# Patient Record
Sex: Female | Born: 1995 | Race: White | Hispanic: No | Marital: Single
Health system: Southern US, Community
[De-identification: ages and names within clinical notes are randomized; demographics above are authoritative.]

---

## 2017-07-14 ENCOUNTER — Emergency Department (HOSPITAL_COMMUNITY): Payer: Managed Care, Other (non HMO)

## 2017-07-14 ENCOUNTER — Encounter (HOSPITAL_COMMUNITY): Payer: Self-pay | Admitting: Emergency Medicine

## 2017-07-14 ENCOUNTER — Other Ambulatory Visit: Payer: Self-pay

## 2017-07-14 ENCOUNTER — Emergency Department (HOSPITAL_COMMUNITY)
Admission: EM | Admit: 2017-07-14 | Discharge: 2017-07-14 | Disposition: A | Discharge status: Home / Self Care | Payer: Managed Care, Other (non HMO) | Attending: Emergency Medicine | Admitting: Emergency Medicine

## 2017-07-14 DIAGNOSIS — Z79899 Other long term (current) drug therapy: Secondary | ICD-10-CM | POA: Diagnosis not present

## 2017-07-14 DIAGNOSIS — R0789 Other chest pain: Secondary | ICD-10-CM | POA: Insufficient documentation

## 2017-07-14 DIAGNOSIS — Z793 Long term (current) use of hormonal contraceptives: Secondary | ICD-10-CM | POA: Insufficient documentation

## 2017-07-14 LAB — COMPREHENSIVE METABOLIC PANEL
ALT: 12 U/L — AB (ref 14–54)
ANION GAP: 11 (ref 5–15)
AST: 19 U/L (ref 15–41)
Albumin: 3.7 g/dL (ref 3.5–5.0)
Alkaline Phosphatase: 44 U/L (ref 38–126)
BUN: 12 mg/dL (ref 6–20)
CHLORIDE: 105 mmol/L (ref 101–111)
CO2: 21 mmol/L — ABNORMAL LOW (ref 22–32)
CREATININE: 0.66 mg/dL (ref 0.44–1.00)
Calcium: 8.8 mg/dL — ABNORMAL LOW (ref 8.9–10.3)
Glucose, Bld: 92 mg/dL (ref 65–99)
Potassium: 4.1 mmol/L (ref 3.5–5.1)
Sodium: 137 mmol/L (ref 135–145)
Total Bilirubin: 0.7 mg/dL (ref 0.3–1.2)
Total Protein: 6.6 g/dL (ref 6.5–8.1)

## 2017-07-14 LAB — I-STAT TROPONIN, ED: Troponin i, poc: 0.01 ng/mL (ref 0.00–0.08)

## 2017-07-14 LAB — CBC
HCT: 33.9 % — ABNORMAL LOW (ref 36.0–46.0)
Hemoglobin: 11.3 g/dL — ABNORMAL LOW (ref 12.0–15.0)
MCH: 30.4 pg (ref 26.0–34.0)
MCHC: 33.3 g/dL (ref 30.0–36.0)
MCV: 91.1 fL (ref 78.0–100.0)
PLATELETS: 233 10*3/uL (ref 150–400)
RBC: 3.72 MIL/uL — AB (ref 3.87–5.11)
RDW: 13.3 % (ref 11.5–15.5)
WBC: 10.7 10*3/uL — ABNORMAL HIGH (ref 4.0–10.5)

## 2017-07-14 LAB — I-STAT BETA HCG BLOOD, ED (MC, WL, AP ONLY)

## 2017-07-14 LAB — D-DIMER, QUANTITATIVE (NOT AT ARMC): D DIMER QUANT: 0.45 ug{FEU}/mL (ref 0.00–0.50)

## 2017-07-14 MED ORDER — KETOROLAC TROMETHAMINE 15 MG/ML IJ SOLN
15.0000 mg | Freq: Once | INTRAMUSCULAR | Status: AC
  Administered 2017-07-14: 15 mg via INTRAMUSCULAR

## 2017-07-14 MED ORDER — KETOROLAC TROMETHAMINE 15 MG/ML IJ SOLN
15.0000 mg | Freq: Once | INTRAMUSCULAR | Status: DC
  Filled 2017-07-14: qty 1

## 2017-07-14 NOTE — ED Triage Notes (Signed)
Pt complaint of left chest pain onset 0500; denies cough.

## 2017-07-14 NOTE — ED Provider Notes (Signed)
[facility]-EMERGENCY DEPT Provider Note  CSN: [id number] Arrival date & time: 07/14/17 1345  Chief Complaint(s) Chest Pain  HPI Katrina Hess is a 22 y.o. female   The history is provided by the patient.  Chest Pain   This is a new problem. Episode onset: 15 hrs. The problem occurs constantly. The problem has not changed since onset.The pain is present in the substernal region. The pain is moderate. The quality of the pain is described as pressure-like and stabbing. The pain radiates to the left shoulder (intermittent). The symptoms are aggravated by deep breathing. Associated symptoms include back pain. Pertinent negatives include no cough, no fever, no leg pain, no lower extremity edema, no nausea and no vomiting.    Patient reports that she was on a long car ride from [place] to here at the end of January.  Patient also takes OCPs.  Denies any prior history of DVT or PEs.  No history of cancer.  No history of autoimmune or clotting disorders.   Past Medical History History reviewed. No pertinent past medical history. There are no active problems to display for this patient.  Home Medication(s) Prior to Admission medications   Medication Sig Start Date End Date Taking? Authorizing Provider  BIOTIN PO Take 1 tablet by mouth daily.    Yes [provider]  CALCIUM PO Take 1 tablet by mouth daily.    Yes [provider]  Cholecalciferol (VITAMIN D PO) Take 1 tablet by mouth daily.    Yes [provider]  MAGNESIUM PO Take 1 tablet by mouth daily.    Yes [provider]  norethindrone-ethinyl estradiol (JUNEL FE,GILDESS FE,LOESTRIN FE) 1-20 MG-MCG tablet Take 1 tablet by mouth daily.   Yes [provider]  Omega-3 Fatty Acids (FISH OIL PO) Take 1 tablet by mouth daily.    Yes [provider]  sertraline (ZOLOFT) 50 MG tablet TAKE 1/2 TAB (25 MG) ORALLY DAILY FOR 5 DAYS THEN INCREASE TO 1 TAB (50 MG) ORALLY DAILY  07/04/17  Yes [provider]                                                                                                                                    Past Surgical History History reviewed. No pertinent surgical history. Family History No family history on file.  Social History Social History   Tobacco Use  . Smoking status: Not on file  Substance Use Topics  . Alcohol use: Not on file  . Drug use: Not on file   Allergies Penicillins  Review of Systems Review of Systems  Constitutional: Negative for fever.  Respiratory: Negative for cough.   Cardiovascular: Positive for chest pain.  Gastrointestinal: Negative for nausea and vomiting.  Musculoskeletal: Positive for back pain.   All other systems are reviewed and are negative for acute change except as noted in the HPI  Physical Exam Vital Signs  I have reviewed the triage vital signs BP 111/62 (BP Location: Left Arm)   Pulse 75   Temp 98.7 F (37.1 C)   Resp 18   LMP 04/13/2017   SpO2 100%   Physical Exam  Constitutional: She is oriented to person, place, and time. She appears well-developed and well-nourished. No distress.  HENT:  Head: Normocephalic and atraumatic.  Nose: Nose normal.  Eyes: Conjunctivae and EOM are normal. Pupils are equal, round, and reactive to light. Right eye exhibits no discharge. Left eye exhibits no discharge. No scleral icterus.  Neck: Normal range of motion. Neck supple.  Cardiovascular: Normal rate and regular rhythm. Exam reveals no gallop and no friction rub.  No murmur heard. Pulmonary/Chest: Effort normal and breath sounds normal. No stridor. No respiratory distress. She has no rales. She exhibits tenderness.    Abdominal: Soft. She exhibits no distension. There is no tenderness.  Musculoskeletal: She exhibits no edema or tenderness.  Neurological: She is alert and oriented to person, place, and time.  Skin: Skin is warm and dry. No rash noted. She is not  diaphoretic. No erythema.  Psychiatric: She has a normal mood and affect.  Vitals reviewed.   ED Results and Treatments Labs (all labs ordered are listed, but only abnormal results are displayed) Labs Reviewed  CBC - Abnormal; Notable for the following components:      Result Value   WBC 10.7 (*)    RBC 3.72 (*)    Hemoglobin 11.3 (*)    HCT 33.9 (*)    All other components within normal limits  COMPREHENSIVE METABOLIC PANEL - Abnormal; Notable for the following components:   CO2 21 (*)    Calcium 8.8 (*)    ALT 12 (*)    All other components within normal limits  D-DIMER, QUANTITATIVE (NOT AT [facility])  I-STAT BETA HCG BLOOD, ED ([facility], [facility], [facility] ONLY)  I-STAT TROPONIN, ED                                                                                                                         EKG  EKG Interpretation  Date/Time:  Friday July 14 2017 13:52:29 EST Ventricular Rate:  69 PR Interval:    QRS Duration: 84 QT Interval:  394 QTC Calculation: 423 R Axis:   75 Text Interpretation:  Sinus rhythm Consider left atrial enlargement Baseline wander in lead(s) V3 NO STEMI No old tracing to compare Confirmed by Drema Pry [phone]) on 07/14/2017 7:59:24 PM      Radiology Dg Chest 2 View  Result Date: 07/14/2017 CLINICAL DATA:  Chest pain EXAM: CHEST  2 VIEW COMPARISON:  None. FINDINGS: Lungs are clear. Heart size and pulmonary vascularity are normal. No adenopathy. No pneumothorax. No bone lesions. There is slight midthoracic dextroscoliosis. IMPRESSION: No edema or consolidation. Electronically Signed   By: Bretta Bang III M.D.   On: 07/14/2017 14:59   Pertinent labs & imaging results that were available during my care of  the patient were reviewed by me and considered in my medical decision making (see chart for details).  Medications Ordered in ED Medications  ketorolac (TORADOL) 15 MG/ML injection 15 mg (not administered)                                                                                                                                     Procedures Procedures  (including critical care time)  Medical Decision Making / ED Course I have reviewed the nursing notes for this encounter and the patient's prior records (if available in EHR or on provided paperwork).    Pleuritic chest pain with chest wall tenderness more consistent for MSK etiology.  Highly atypical for ACS.  EKG without acute ischemic changes or evidence of pericarditis.  Initial troponin negative.  Feel this is sufficient to rule out ACS given duration of patient's pain.  Low pretest probability for pulmonary embolism but patient is not PERC negative.  D-dimer negative.  Presentation not classic for aortic dissection or esophageal perforation. Chest x-ray without evidence suggestive of pneumonia, pneumothorax, pneumomediastinum.  No abnormal contour of the mediastinum to suggest dissection. No evidence of acute injuries.  The patient appears reasonably screened and/or stabilized for discharge and I doubt any other medical condition or other [facility] requiring further screening, evaluation, or treatment in the ED at this time prior to discharge.  The patient is safe for discharge with strict return precautions.   Final Clinical Impression(s) / ED Diagnoses Final diagnoses:  Chest wall pain    Disposition: Discharge  Condition: Good  I have discussed the results, Dx and Tx plan with the patient who expressed understanding and agree(s) with the plan. Discharge instructions discussed at great length. The patient was given strict return precautions who verbalized understanding of the instructions. No further questions at time of discharge.    ED Discharge Orders    None       Follow Up: Primary care provider  Schedule an appointment as soon as possible for a visit  As needed     This chart was dictated using voice recognition software.  Despite best efforts to proofread,   errors can occur which can change the documentation meaning.   Nira Connardama, Pedro Eduardo, MD 07/14/17 2234

## 2018-11-27 IMAGING — CR DG CHEST 2V
2 series · 2 of 2 positions shown · non-contrast
Comparison: None.

CLINICAL DATA: Chest pain

EXAM:
CHEST  2 VIEW

[w chest pa]
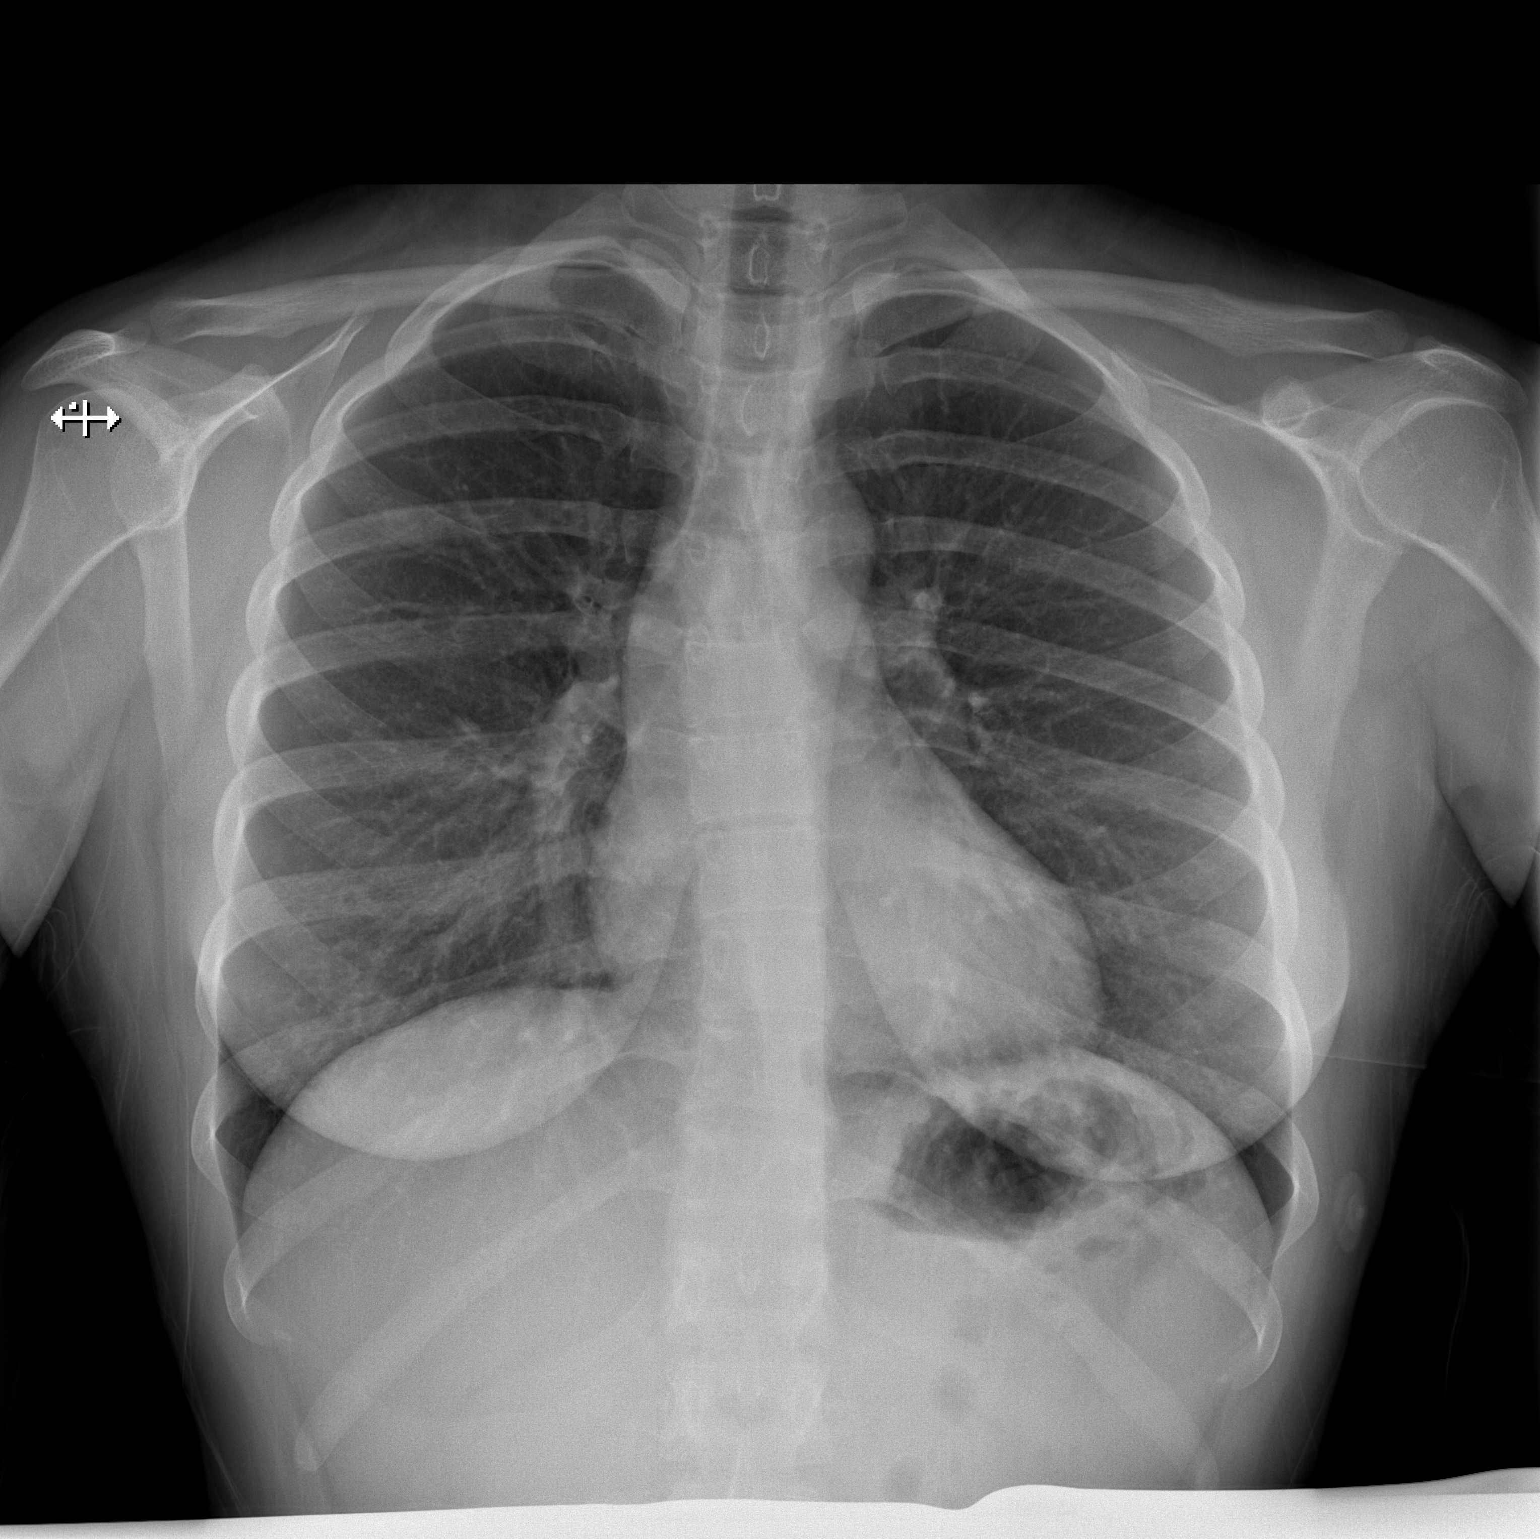

[w chest lat]
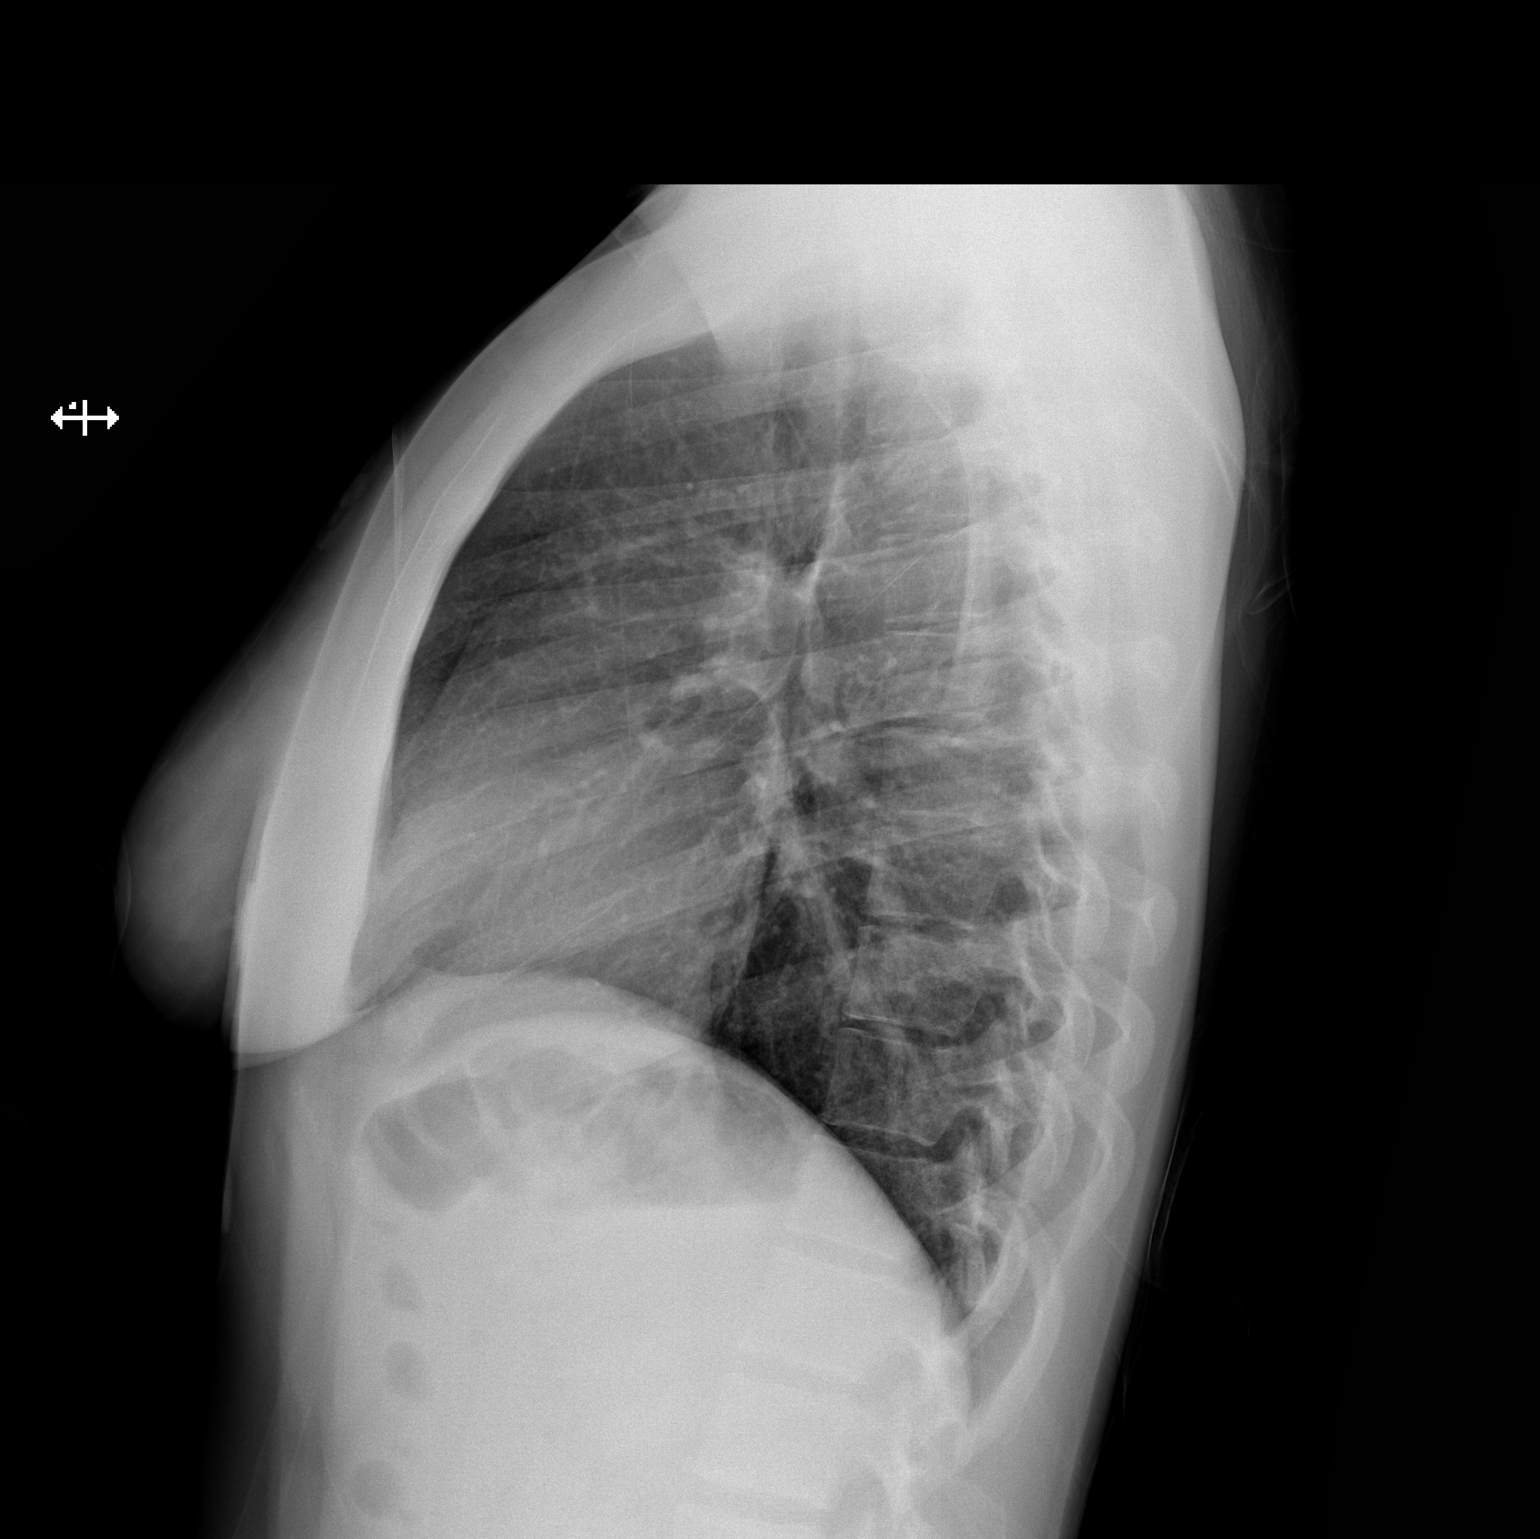

[2 of 2 positions shown; findings below may reference images not displayed]

FINDINGS: Lungs are clear. Heart size and pulmonary vascularity are normal. No
adenopathy. No pneumothorax. No bone lesions. There is slight
midthoracic dextroscoliosis.
IMPRESSION: No edema or consolidation.
